# Patient Record
Sex: Female | Born: 1980 | Hispanic: No | Marital: Single | Smoking: Current every day smoker
Health system: Southern US, Community
[De-identification: ages and names within clinical notes are randomized; demographics above are authoritative.]

## PROBLEM LIST (undated history)

## (undated) DIAGNOSIS — E282 Polycystic ovarian syndrome: Secondary | ICD-10-CM

---

## 2015-01-12 ENCOUNTER — Encounter (HOSPITAL_COMMUNITY): Payer: Self-pay | Admitting: Emergency Medicine

## 2015-01-12 ENCOUNTER — Emergency Department (HOSPITAL_COMMUNITY)
Admission: EM | Admit: 2015-01-12 | Discharge: 2015-01-12 | Disposition: A | Discharge status: Home / Self Care | Payer: Medicaid Other | Attending: Emergency Medicine | Admitting: Emergency Medicine

## 2015-01-12 ENCOUNTER — Emergency Department (HOSPITAL_COMMUNITY): Payer: Medicaid Other

## 2015-01-12 DIAGNOSIS — R51 Headache: Secondary | ICD-10-CM

## 2015-01-12 DIAGNOSIS — F17200 Nicotine dependence, unspecified, uncomplicated: Secondary | ICD-10-CM | POA: Insufficient documentation

## 2015-01-12 DIAGNOSIS — R059 Cough, unspecified: Secondary | ICD-10-CM

## 2015-01-12 DIAGNOSIS — Z8639 Personal history of other endocrine, nutritional and metabolic disease: Secondary | ICD-10-CM | POA: Insufficient documentation

## 2015-01-12 DIAGNOSIS — Z8679 Personal history of other diseases of the circulatory system: Secondary | ICD-10-CM | POA: Diagnosis not present

## 2015-01-12 DIAGNOSIS — J0110 Acute frontal sinusitis, unspecified: Secondary | ICD-10-CM | POA: Diagnosis not present

## 2015-01-12 DIAGNOSIS — R519 Headache, unspecified: Secondary | ICD-10-CM

## 2015-01-12 DIAGNOSIS — R05 Cough: Secondary | ICD-10-CM

## 2015-01-12 HISTORY — DX: Polycystic ovarian syndrome: E28.2

## 2015-01-12 MED ORDER — KETOROLAC TROMETHAMINE 30 MG/ML IJ SOLN
30.0000 mg | Freq: Once | INTRAMUSCULAR | Status: DC
  Filled 2015-01-12: qty 1

## 2015-01-12 MED ORDER — METOCLOPRAMIDE HCL 10 MG PO TABS
5.0000 mg | ORAL_TABLET | Freq: Once | ORAL | Status: AC
  Administered 2015-01-12: 5 mg via ORAL
  Filled 2015-01-12: qty 1

## 2015-01-12 MED ORDER — KETOROLAC TROMETHAMINE 30 MG/ML IJ SOLN
30.0000 mg | Freq: Once | INTRAMUSCULAR | Status: AC
Start: 2015-01-12 — End: 2015-01-12
  Administered 2015-01-12: 30 mg via INTRAMUSCULAR

## 2015-01-12 MED ORDER — AMOXICILLIN-POT CLAVULANATE 875-125 MG PO TABS: 1.0000 | ORAL_TABLET | Freq: Two times a day (BID) | ORAL | Status: AC

## 2015-01-12 MED ORDER — DIPHENHYDRAMINE HCL 25 MG PO CAPS
25.0000 mg | ORAL_CAPSULE | Freq: Once | ORAL | Status: AC
  Administered 2015-01-12: 25 mg via ORAL
  Filled 2015-01-12: qty 1

## 2015-01-12 MED ORDER — BENZONATATE 100 MG PO CAPS
100.0000 mg | ORAL_CAPSULE | Freq: Three times a day (TID) | ORAL | Status: AC
Start: 2015-01-12 — End: ?

## 2015-01-12 NOTE — ED Notes (Signed)
Pt sts frontal HA since having near syncope on Saturday; pt sts hx of same in past; pt sts congestion and cough with green sputum

## 2015-01-12 NOTE — Discharge Instructions (Signed)
Please read attached information. If you experience any new or worsening signs or symptoms please return to the emergency room for evaluation. Please follow-up with your primary care provider or specialist as discussed. Please use medication prescribed only as directed and discontinue taking if you have any concerning signs or symptoms.   °

## 2015-01-12 NOTE — ED Provider Notes (Signed)
ffCSN: [id number]     Arrival date & time 01/12/15  1445 History  By signing my name below, I, Placido Sou, attest that this documentation has been prepared under the direction and in the presence of [employer], PA-C. Electronically Signed: Placido Sou, ED Scribe. 01/12/2015. 5:29 PM.   Chief Complaint  Patient presents with  . Headache  . Cough   The history is provided by the patient. No language interpreter was used.    HPI Comments: Claudia Ingram is a 35 y.o. female who presents to the Emergency Department complaining of constant, moderate, gradual onset, anterior HA with onset 2 days ago. Pt notes that she hadn't eaten all day 3 days ago while working, stood up quickly, lost consciousness and fell to the floor and a co-worker told her she shook on the floor momentarily with her HA beginning the next morning. Pt describes the pain as a pressure/throbbing and says it is similar to previous, slow onset. Pt notes taking 2x OTC tylenol yesterday which provided short term relief. . She notes a hx of migraines and typically receives an injection which provides relief. Pt denies any recent head trauma. Pt denies any change in smell, visual disturbances or other associated symptoms at this time.    She also complains of multiple cold like symptoms with onset 3 weeks ago. Pt notes associated chest congestion, rhinorrhea, unproductive cough and post nasal drip. She notes she was dx with bronchitis 2 weeks ago and has been taking prednisone which she finished 1 week ago and denies relief of her symptoms. She denies any other associated symptoms at this time.     Past Medical History  Diagnosis Date  . PCOS (polycystic ovarian syndrome)    History reviewed. No pertinent past surgical history. History reviewed. No pertinent family history. Social History  Substance Use Topics  . Smoking status: Current Every Day Smoker  . Smokeless tobacco: None  . Alcohol Use: No   OB History    No  data available     Review of Systems  All other systems reviewed and are negative.   Allergies  Gabapentin  Home Medications   Prior to Admission medications   Medication Sig Start Date End Date Taking? Authorizing Provider  amoxicillin-clavulanate (AUGMENTIN) 875-125 MG tablet Take 1 tablet by mouth every 12 (twelve) hours. 01/12/15   Eyvonne Mechanic, PA-C  benzonatate (TESSALON) 100 MG capsule Take 1 capsule (100 mg total) by mouth every 8 (eight) hours. 01/12/15   Afra Tricarico, PA-C   BP 126/65 mmHg  Pulse 86  Temp(Src) 98.7 F (37.1 C) (Oral)  Resp 22  SpO2 98%    Physical Exam  Constitutional: She is oriented to person, place, and time. She appears well-developed and well-nourished.  HENT:  Head: Normocephalic and atraumatic.  Mouth/Throat: No oropharyngeal exudate.  Rhinorrhea; ears nml bilaterally; uvula midline; no postpharyngeal swelling or edema; tonsils symmetrical non-enlarged and without exudate; maxillary frontal sinuses tender to slight percussion  Neck: Normal range of motion. Neck supple.  Cardiovascular: Normal rate.   Pulmonary/Chest: Effort normal and breath sounds normal. No respiratory distress. She has no wheezes. She has no rales.  Abdominal: Soft.  Musculoskeletal: Normal range of motion.  Neurological: She is alert and oriented to person, place, and time.  Skin: Skin is warm and dry. She is not diaphoretic.  Psychiatric: She has a normal mood and affect. Her behavior is normal.  Nursing note and vitals reviewed.   ED Course  Procedures  DIAGNOSTIC STUDIES:  Oxygen Saturation is 98% on RA, normal by my interpretation.    COORDINATION OF CARE: 5:23 PM Pt presents today due to a HA and cold symptoms. Discussed next steps including a toradol injection, Reglan, benadryl and reevaluation. Pt understood and is agreeable to the plan.   Labs Review Labs Reviewed - No data to display  Imaging Review Dg Chest 2 View  01/12/2015  CLINICAL DATA:   Productive cough x 1 1/2 months. Green phlegm now. Diagnosed with bronchitis a couple of weeks ago and finished meds last week. Migraine x 3 days. Pain between shoulder blades began today. Smoker of 1 1/2 pks x 20 years. EXAM: CHEST  2 VIEW COMPARISON:  12/30/2014. FINDINGS: The heart size and mediastinal contours are within normal limits. Both lungs are clear. The visualized skeletal structures are unremarkable. IMPRESSION: No active cardiopulmonary disease.  No change from priors. Electronically Signed   By: Elsie StainJohn T Curnes M.D.   On: 01/12/2015 15:52   I have personally reviewed and evaluated these images as part of my medical decision-making.   EKG Interpretation None      MDM   Final diagnoses:  Acute nonintractable headache, unspecified headache type  Acute frontal sinusitis, recurrence not specified  Cough    Labs: none indicated  Imaging: DG chest no specific findings  Consults: none  Therapeutics: Reglan, Toradol, Benadryl/ discharged home on Augmentin, Tessalon Perles  Assessment:  Plan: Patient's presentation most consistent with bacterial sinusitis., Headache with no red flags, treated with above medications with symptomatic improvement. Patient will be prescribed Augmentin, Tessalon Perles, and given a work note for 2 days. After reevaluation patient reports she's greatly improved with very minimal headache symptoms feeling much better. Patient is afebrile with reassuring vital signs. Pt given strict return precautions, verbalized understanding and agreement to today's plan and had no further questions or concerns at the time of discharge.   I personally performed the services described in this documentation, which was scribed in my presence. The recorded information has been reviewed and is accurate.    Eyvonne MechanicJeffrey Shirl [place], PA-C 01/12/15 2229  Benjiman CoreNathan Pickering, MD 01/12/15 [phone]

## 2015-01-12 NOTE — ED Notes (Signed)
Pt A&OX4, ambulatory at d/c with steady gait, NAD 

## 2017-04-02 IMAGING — CR DG CHEST 2V
2 series · 2 of 2 positions shown · non-contrast
Comparison: 12/30/2014.

CLINICAL DATA: Productive cough x 1 [DATE] months. Green phlegm now.
Diagnosed with bronchitis a couple of weeks ago and finished meds
last week. Migraine x 3 days. Pain between shoulder blades began
today. Smoker of 1 [DATE] pks x 20 years.

EXAM:
CHEST  2 VIEW

[chest pa]
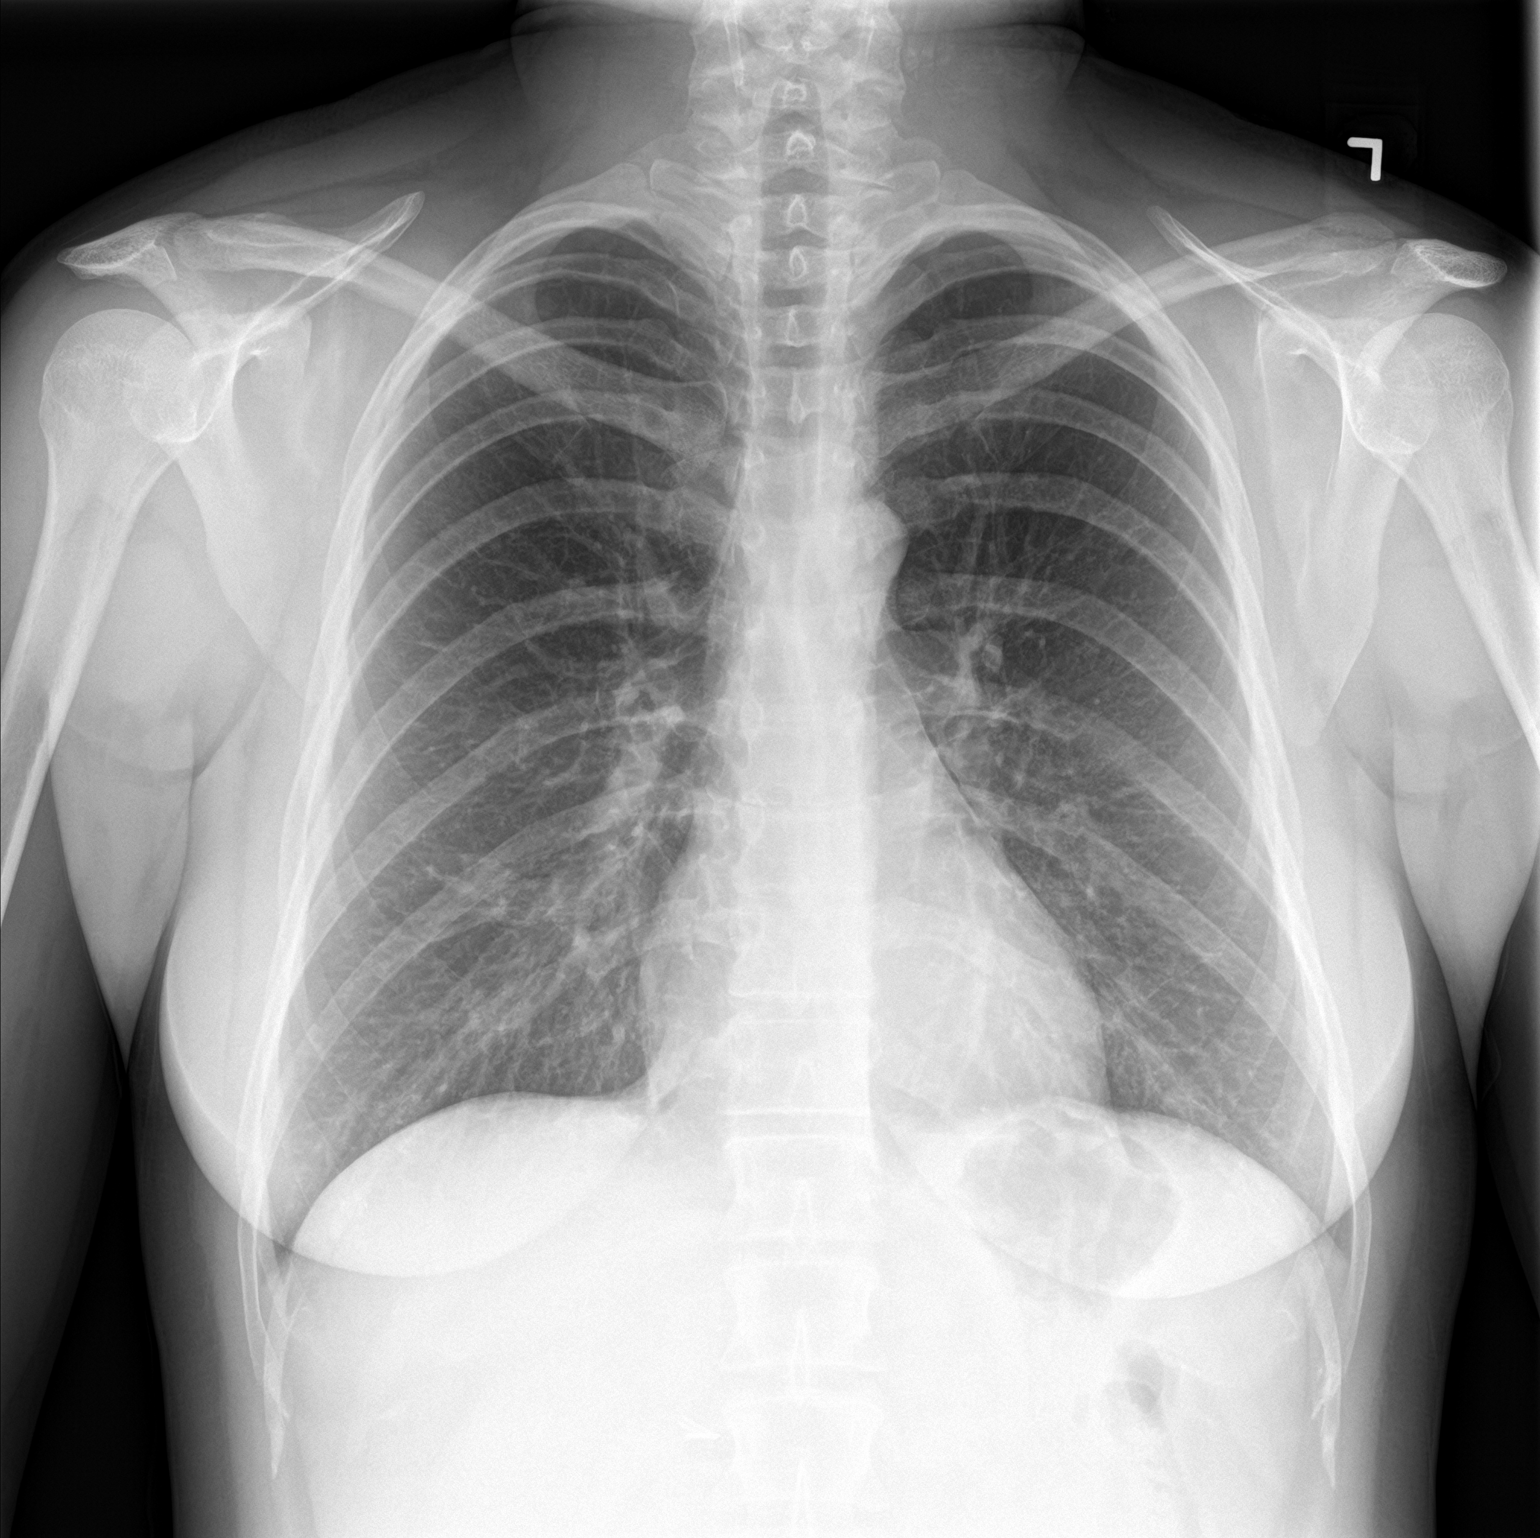

[chest lat]
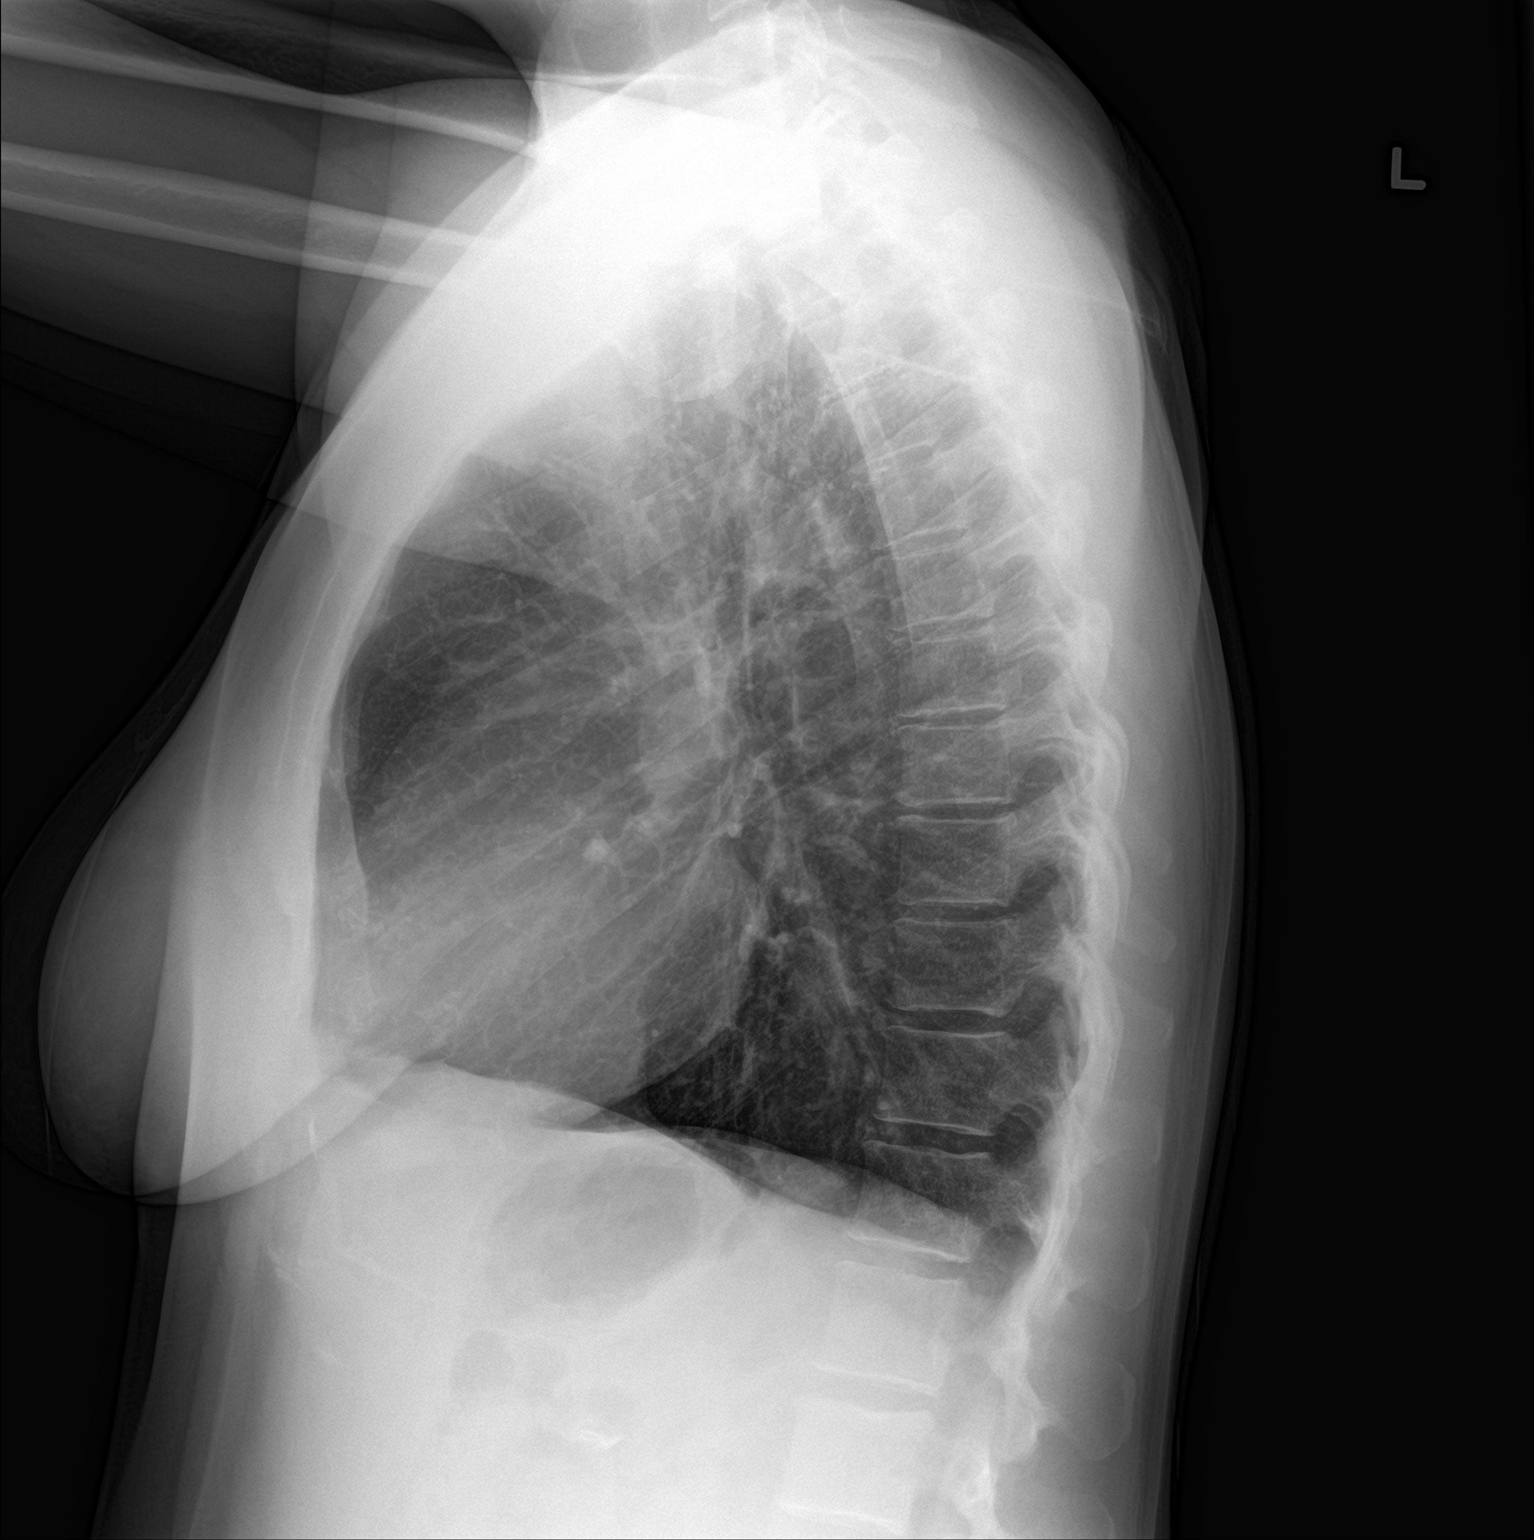

[2 of 2 positions shown; findings below may reference images not displayed]

FINDINGS: The heart size and mediastinal contours are within normal limits.
Both lungs are clear. The visualized skeletal structures are
unremarkable.
IMPRESSION: No active cardiopulmonary disease.  No change from priors.
